# Patient Record
Sex: Female | Born: 1960 | Race: White | Hispanic: No | Smoking: Never smoker
Health system: Southern US, Community
[De-identification: ages and names within clinical notes are randomized; demographics above are authoritative.]

## PROBLEM LIST (undated history)

## (undated) DIAGNOSIS — B019 Varicella without complication: Secondary | ICD-10-CM

## (undated) HISTORY — DX: Varicella without complication: B01.9

## (undated) HISTORY — PX: DILATION AND CURETTAGE OF UTERUS: SHX78

---

## 1998-11-15 ENCOUNTER — Other Ambulatory Visit: Admission: RE | Admit: 1998-11-15 | Discharge: 1998-11-15 | Payer: Self-pay | Admitting: Obstetrics and Gynecology

## 2000-08-25 ENCOUNTER — Other Ambulatory Visit: Admission: RE | Admit: 2000-08-25 | Discharge: 2000-08-25 | Payer: Self-pay | Admitting: Obstetrics and Gynecology

## 2004-05-13 ENCOUNTER — Ambulatory Visit: Payer: Self-pay | Admitting: Family Medicine

## 2005-02-23 ENCOUNTER — Ambulatory Visit: Payer: Self-pay | Admitting: Family Medicine

## 2005-03-09 ENCOUNTER — Ambulatory Visit: Payer: Self-pay | Admitting: Family Medicine

## 2005-03-31 ENCOUNTER — Ambulatory Visit: Payer: Self-pay | Admitting: Family Medicine

## 2007-04-29 ENCOUNTER — Ambulatory Visit: Payer: Self-pay | Admitting: Family Medicine

## 2007-04-29 DIAGNOSIS — J019 Acute sinusitis, unspecified: Secondary | ICD-10-CM | POA: Insufficient documentation

## 2007-04-29 DIAGNOSIS — Z9189 Other specified personal risk factors, not elsewhere classified: Secondary | ICD-10-CM | POA: Insufficient documentation

## 2007-04-29 DIAGNOSIS — J309 Allergic rhinitis, unspecified: Secondary | ICD-10-CM | POA: Insufficient documentation

## 2008-01-12 ENCOUNTER — Ambulatory Visit: Payer: Self-pay | Admitting: Family Medicine

## 2008-12-05 ENCOUNTER — Ambulatory Visit: Payer: Self-pay | Admitting: Family Medicine

## 2008-12-05 DIAGNOSIS — M5412 Radiculopathy, cervical region: Secondary | ICD-10-CM | POA: Insufficient documentation

## 2009-12-23 ENCOUNTER — Other Ambulatory Visit
Admission: RE | Admit: 2009-12-23 | Discharge: 2009-12-23 | Payer: Self-pay | Source: Home / Self Care | Admitting: Family Medicine

## 2009-12-23 ENCOUNTER — Ambulatory Visit: Payer: Self-pay | Admitting: Family Medicine

## 2009-12-25 ENCOUNTER — Encounter: Payer: Self-pay | Admitting: Family Medicine

## 2010-01-16 ENCOUNTER — Ambulatory Visit: Payer: Self-pay | Admitting: Family Medicine

## 2010-01-16 LAB — CONVERTED CEMR LAB
Bilirubin Urine: NEGATIVE
Glucose, Urine, Semiquant: NEGATIVE
Protein, U semiquant: NEGATIVE
Specific Gravity, Urine: 1.01
pH: 6.5

## 2010-01-23 LAB — CONVERTED CEMR LAB
ALT: 20 units/L (ref 0–35)
AST: 17 units/L (ref 0–37)
Albumin: 3.7 g/dL (ref 3.5–5.2)
BUN: 16 mg/dL (ref 6–23)
Cholesterol: 201 mg/dL — ABNORMAL HIGH (ref 0–200)
Creatinine, Ser: 0.8 mg/dL (ref 0.4–1.2)
Eosinophils Relative: 1.1 % (ref 0.0–5.0)
Glucose, Bld: 87 mg/dL (ref 70–99)
HDL: 45.2 mg/dL (ref >39.00)
Lymphocytes Relative: 31.5 % (ref 12.0–46.0)
Monocytes Relative: 6.6 % (ref 3.0–12.0)
Neutrophils Relative %: 60.3 % (ref 43.0–77.0)
Platelets: 252 10*3/uL (ref 150.0–400.0)
RBC: 4.53 M/uL (ref 3.87–5.11)
Sodium: 136 meq/L (ref 135–145)
Total CHOL/HDL Ratio: 4
Triglycerides: 93 mg/dL (ref 0.0–149.0)
VLDL: 18.6 mg/dL (ref 0.0–40.0)
WBC: 6.4 10*3/uL (ref 4.5–10.5)

## 2010-02-27 NOTE — Assessment & Plan Note (Signed)
Summary: cpx//pap//ccm-labs//ccm   Vital Signs:  Patient profile:   50 year old female Weight:      208 pounds O2 Sat:      98 % Temp:     98.5 degrees F Pulse rate:   79 / minute BP sitting:   112 / 80  (left arm)  Vitals Entered By: Pura Spice, RN (December 23, 2009 3:04 PM) CC: cpx    History of Present Illness: 50 yr old female for a cpx. She has not had an exam or labs for about 5 years now. She feels fine and has no complaints.   Allergies (verified): No Known Drug Allergies  Past History:  Past Medical History: Allergic rhinitis Chickenpox  Past Surgical History: Reviewed history from 04/29/2007 and no changes required. D&C  Family History: Reviewed history from 09/21/2006 and no changes required. Family History Diabetes 1st degree relative Family History High cholesterol Family History Hypertension Family History Other cancer-breast Family History of Cardiovascular disorder  Social History: Reviewed history from 04/29/2007 and no changes required. Married Current Smoker Alcohol use-yes Occupation:massage therapist  Review of Systems  The patient denies anorexia, fever, weight loss, weight gain, vision loss, decreased hearing, hoarseness, chest pain, syncope, dyspnea on exertion, peripheral edema, prolonged cough, headaches, hemoptysis, abdominal pain, melena, hematochezia, severe indigestion/heartburn, hematuria, incontinence, genital sores, muscle weakness, suspicious skin lesions, transient blindness, difficulty walking, depression, unusual weight change, abnormal bleeding, enlarged lymph nodes, angioedema, breast masses, and testicular masses.    Physical Exam  General:  overweight-appearing.   Head:  Normocephalic and atraumatic without obvious abnormalities. No apparent alopecia or balding. Eyes:  No corneal or conjunctival inflammation noted. EOMI. Perrla. Funduscopic exam benign, without hemorrhages, exudates or papilledema. Vision grossly  normal. Ears:  External ear exam shows no significant lesions or deformities.  Otoscopic examination reveals clear canals, tympanic membranes are intact bilaterally without bulging, retraction, inflammation or discharge. Hearing is grossly normal bilaterally. Nose:  External nasal examination shows no deformity or inflammation. Nasal mucosa are pink and moist without lesions or exudates. Mouth:  Oral mucosa and oropharynx without lesions or exudates.  Teeth in good repair. Neck:  No deformities, masses, or tenderness noted. Chest Wall:  No deformities, masses, or tenderness noted. Breasts:  No mass, nodules, thickening, tenderness, bulging, retraction, inflamation, nipple discharge or skin changes noted.   Lungs:  Normal respiratory effort, chest expands symmetrically. Lungs are clear to auscultation, no crackles or wheezes. Heart:  Normal rate and regular rhythm. S1 and S2 normal without gallop, murmur, click, rub or other extra sounds. Abdomen:  Bowel sounds positive,abdomen soft and non-tender without masses, organomegaly or hernias noted. Genitalia:  Pelvic Exam:        External: normal female genitalia without lesions or masses        Vagina: normal without lesions or masses        Cervix: normal without lesions or masses        Adnexa: normal bimanual exam without masses or fullness        Uterus: normal by palpation        Pap smear: performed Msk:  No deformity or scoliosis noted of thoracic or lumbar spine.   Pulses:  R and L carotid,radial,femoral,dorsalis pedis and posterior tibial pulses are full and equal bilaterally Extremities:  No clubbing, cyanosis, edema, or deformity noted with normal full range of motion of all joints.   Neurologic:  No cranial nerve deficits noted. Station and gait are normal. Plantar reflexes are down-going  bilaterally. DTRs are symmetrical throughout. Sensory, motor and coordinative functions appear intact. Skin:  Intact without suspicious lesions or  rashes Cervical Nodes:  No lymphadenopathy noted Axillary Nodes:  No palpable lymphadenopathy Inguinal Nodes:  No significant adenopathy Psych:  Cognition and judgment appear intact. Alert and cooperative with normal attention span and concentration. No apparent delusions, illusions, hallucinations   Impression & Recommendations:  Problem # 1:  HEALTH MAINTENANCE EXAM (ICD-V70.0)  Complete Medication List: 1)  Vicodin Es 7.5-750 Mg Tabs (Hydrocodone-acetaminophen) .Marland Kitchen.. 1 q 6 hours as needed pain 2)  Diclofenac Sodium 50 Mg Tbec (Diclofenac sodium) .... Three times a day as needed pain 3)  Flexeril 10 Mg Tabs (Cyclobenzaprine hcl) .... Three times a day as needed spasm  Other Orders: Tdap => 58yrs IM (30865) Admin 1st Vaccine (78469)  Patient Instructions: 1)  It is important that you exercise reguarly at least 20 minutes 5 times a week. If you develop chest pain, have severe difficulty breathing, or feel very tired, stop exercising immediately and seek medical attention.  2)  You need to lose weight. Consider a lower calorie diet and regular exercise.  3)  Schedule your mammogram.  4)  set up fasting labs soon    Orders Added: 1)  Tdap => 72yrs IM [90715] 2)  Admin 1st Vaccine [90471] 3)  Est. Patient 40-64 years [62952]   Immunizations Administered:  Tetanus Vaccine:    Vaccine Type: Tdap    Mfr: GlaxoSmithKline    Dose: 0.5 ml    Route: IM    Given by: Pura Spice, RN    Lot #: [id number]    VIS given: 12/14/07 version given December 23, 2009.   Immunizations Administered:  Tetanus Vaccine:    Vaccine Type: Tdap    Mfr: GlaxoSmithKline    Dose: 0.5 ml    Route: IM    Given by: Pura Spice, RN    Lot #: [id number]    VIS given: 12/14/07 version given December 23, 2009.

## 2010-02-27 NOTE — Letter (Signed)
Summary: Results Follow-up Letter  [place] [facility]  [address] [place], [place] [address]   Phone: [phone]  Fax: [phone]    12/25/2009  [address], [place]  [address]  Dear Ms. Mcclean,   The following are the results of your recent test(s):  Test     Result     Pap Smear    Normal___yes  repeat 1 yr.   Sincerely,     Dr Gershon Crane, MD [place] [facility]

## 2010-12-16 ENCOUNTER — Encounter: Payer: Self-pay | Admitting: Family Medicine

## 2010-12-16 ENCOUNTER — Ambulatory Visit (INDEPENDENT_AMBULATORY_CARE_PROVIDER_SITE_OTHER): Payer: BC Managed Care – PPO | Admitting: Family Medicine

## 2010-12-16 VITALS — BP 110/70 | HR 89 | Temp 98.5°F | Wt 219.0 lb

## 2010-12-16 DIAGNOSIS — J329 Chronic sinusitis, unspecified: Secondary | ICD-10-CM

## 2010-12-16 MED ORDER — FLUCONAZOLE 150 MG PO TABS: 150.0000 mg | ORAL_TABLET | Freq: Once | ORAL | Status: DC

## 2010-12-16 MED ORDER — LEVOFLOXACIN 500 MG PO TABS: 500.0000 mg | ORAL_TABLET | Freq: Every day | ORAL | Status: AC

## 2010-12-17 ENCOUNTER — Encounter: Payer: Self-pay | Admitting: Family Medicine

## 2010-12-17 NOTE — Progress Notes (Signed)
  Subjective:    Patient ID: Carol Kirk, female    DOB: 1960-07-16, 50 y.o.   MRN: [id number]  HPI Here for one week of sinus pressure, PND, ST, HA, dry cough, and fever.    Review of Systems  Constitutional: Positive for fever.  HENT: Positive for congestion, postnasal drip and sinus pressure.   Respiratory: Positive for cough.        Objective:   Physical Exam  Constitutional: She appears well-developed and well-nourished.  HENT:  Right Ear: External ear normal.  Left Ear: External ear normal.  Nose: Nose normal.  Mouth/Throat: Oropharynx is clear and moist.  Eyes: Conjunctivae are normal. Pupils are equal, round, and reactive to light.  Neck: No thyromegaly present.  Pulmonary/Chest: Effort normal and breath sounds normal.  Lymphadenopathy:    She has no cervical adenopathy.          Assessment & Plan:  Rest, fluids, Mucinex

## 2011-01-30 ENCOUNTER — Telehealth: Payer: Self-pay | Admitting: *Deleted

## 2011-01-30 NOTE — Telephone Encounter (Signed)
Pt had mammogram done at breast clinic in [place] [phone] on 01-13-11

## 2011-01-30 NOTE — Telephone Encounter (Signed)
Pt had a mammogram and she got a call stating they needed additional test on the right breast and Dr Claris Che office would need to tell her why.  Pt is very concerned and would like someone to call at their earliest convenience

## 2011-01-30 NOTE — Telephone Encounter (Signed)
We have already talked to her about this. I signed the order to do the additional testing, but I have not heard anything specific about the findings yet

## 2011-02-02 ENCOUNTER — Encounter: Payer: Self-pay | Admitting: Family Medicine

## 2011-02-27 ENCOUNTER — Encounter: Payer: Self-pay | Admitting: Family Medicine

## 2011-08-10 ENCOUNTER — Telehealth: Payer: Self-pay | Admitting: Family Medicine

## 2011-08-10 NOTE — Telephone Encounter (Signed)
Pt requesting an order to be placed to a diagnostic mammogram. Please send to [facility]   Please fax to [phone]

## 2011-08-10 NOTE — Telephone Encounter (Signed)
I faxed order to below number.  

## 2011-08-10 NOTE — Telephone Encounter (Signed)
Written, in your box

## 2011-08-18 ENCOUNTER — Telehealth: Payer: Self-pay | Admitting: Family Medicine

## 2011-08-18 NOTE — Telephone Encounter (Signed)
I did refax order to below number.

## 2011-08-18 NOTE — Telephone Encounter (Signed)
Pt called and said that the original fax # for [facility] was incorrect. Pt is requesting the order for diagnostic mammogram be resent to different fax #. The correct Fax # is [phone] [facility].

## 2011-08-27 ENCOUNTER — Telehealth: Payer: Self-pay | Admitting: Family Medicine

## 2011-08-27 NOTE — Telephone Encounter (Signed)
The [facility] for [facility] called and said that the pt is schd to come in for a MM today a 1pm. May need to have an ultrasound as well. Sent over an order req yesterday, but haven't gotten a response. Pls fax back asap this morning to fax # [phone] [employer]

## 2011-08-27 NOTE — Telephone Encounter (Signed)
Note was signed and I faxed it.

## 2011-09-09 ENCOUNTER — Encounter: Payer: Self-pay | Admitting: Family Medicine

## 2012-01-11 ENCOUNTER — Telehealth: Payer: Self-pay | Admitting: Family Medicine

## 2012-01-11 NOTE — Telephone Encounter (Signed)
Done/kjh 

## 2012-01-11 NOTE — Telephone Encounter (Signed)
Okay to schedule

## 2012-01-11 NOTE — Telephone Encounter (Signed)
Pt's husband states wife upper resp/sinus, sore throat and has been going on a couple of weeks, no better. Would like to be seen in the AM.

## 2012-01-12 ENCOUNTER — Encounter: Payer: Self-pay | Admitting: Family Medicine

## 2012-01-12 ENCOUNTER — Ambulatory Visit (INDEPENDENT_AMBULATORY_CARE_PROVIDER_SITE_OTHER): Payer: BC Managed Care – PPO | Admitting: Family Medicine

## 2012-01-12 VITALS — BP 114/64 | HR 91 | Temp 97.9°F | Wt 209.0 lb

## 2012-01-12 DIAGNOSIS — J329 Chronic sinusitis, unspecified: Secondary | ICD-10-CM

## 2012-01-12 MED ORDER — DICLOFENAC POTASSIUM 50 MG PO TABS: 50.0000 mg | ORAL_TABLET | Freq: Three times a day (TID) | ORAL | Status: DC | PRN

## 2012-01-12 MED ORDER — CYCLOBENZAPRINE HCL 10 MG PO TABS: 10.0000 mg | ORAL_TABLET | Freq: Three times a day (TID) | ORAL | Status: DC | PRN

## 2012-01-12 MED ORDER — FLUCONAZOLE 150 MG PO TABS: 150.0000 mg | ORAL_TABLET | Freq: Once | ORAL | Status: AC

## 2012-01-12 MED ORDER — LEVOFLOXACIN 500 MG PO TABS: 500.0000 mg | ORAL_TABLET | Freq: Every day | ORAL | Status: AC

## 2012-01-12 NOTE — Progress Notes (Signed)
  Subjective:    Patient ID: Carol Kirk, female    DOB: Jun 07, 1960, 51 y.o.   MRN: [id number]  HPI Here for 2 weeks of sinus pressure, HA, ST, PND, and a dry cough. No fever.    Review of Systems  Constitutional: Negative.   HENT: Positive for congestion, postnasal drip and sinus pressure.   Eyes: Negative.   Respiratory: Negative.        Objective:   Physical Exam  Constitutional: She appears well-developed and well-nourished.  HENT:  Right Ear: External ear normal.  Left Ear: External ear normal.  Nose: Nose normal.  Mouth/Throat: Oropharynx is clear and moist.  Eyes: Conjunctivae normal are normal.  Pulmonary/Chest: Effort normal and breath sounds normal.  Lymphadenopathy:    She has no cervical adenopathy.          Assessment & Plan:  Add Mucinex prn

## 2012-09-30 ENCOUNTER — Encounter: Payer: Self-pay | Admitting: Family Medicine

## 2012-09-30 ENCOUNTER — Ambulatory Visit (INDEPENDENT_AMBULATORY_CARE_PROVIDER_SITE_OTHER): Payer: BC Managed Care – PPO | Admitting: Family Medicine

## 2012-09-30 ENCOUNTER — Telehealth: Payer: Self-pay | Admitting: Family Medicine

## 2012-09-30 VITALS — BP 108/66 | HR 78 | Temp 98.6°F | Wt 221.0 lb

## 2012-09-30 DIAGNOSIS — J019 Acute sinusitis, unspecified: Secondary | ICD-10-CM

## 2012-09-30 MED ORDER — DICLOFENAC POTASSIUM 50 MG PO TABS: 50.0000 mg | ORAL_TABLET | Freq: Three times a day (TID) | ORAL | Status: DC | PRN

## 2012-09-30 MED ORDER — FLUCONAZOLE 150 MG PO TABS: 150.0000 mg | ORAL_TABLET | Freq: Once | ORAL | Status: DC

## 2012-09-30 MED ORDER — HYDROCODONE-ACETAMINOPHEN 10-325 MG PO TABS: 1.0000 | ORAL_TABLET | Freq: Four times a day (QID) | ORAL | Status: AC | PRN

## 2012-09-30 MED ORDER — CYCLOBENZAPRINE HCL 10 MG PO TABS: 10.0000 mg | ORAL_TABLET | Freq: Three times a day (TID) | ORAL | Status: AC | PRN

## 2012-09-30 MED ORDER — LEVOFLOXACIN 500 MG PO TABS: 500.0000 mg | ORAL_TABLET | Freq: Every day | ORAL | Status: AC

## 2012-09-30 NOTE — Telephone Encounter (Signed)
Pharm is calling to notify  MD theres a drug interaction with fluconazole and levofloxacin.

## 2012-09-30 NOTE — Telephone Encounter (Signed)
I spoke with pharmacy, per Dr. Clent Ridges he is aware of this.

## 2012-09-30 NOTE — Progress Notes (Signed)
  Subjective:    Patient ID: Carol Kirk, female    DOB: [birth year], 52 y.o.   MRN: [id number]  HPI Here for one week of sinus pressure, PND, HA, and ST. On Mucinex.    Review of Systems  Constitutional: Negative.   HENT: Positive for congestion, postnasal drip and sinus pressure.   Eyes: Negative.   Respiratory: Negative.        Objective:   Physical Exam  Constitutional: She appears well-developed and well-nourished.  HENT:  Right Ear: External ear normal.  Left Ear: External ear normal.  Nose: Nose normal.  Mouth/Throat: Oropharynx is clear and moist.  Eyes: Conjunctivae are normal.  Pulmonary/Chest: Effort normal and breath sounds normal.  Lymphadenopathy:    She has no cervical adenopathy.          Assessment & Plan:  Drink fluids.

## 2012-10-05 ENCOUNTER — Emergency Department (INDEPENDENT_AMBULATORY_CARE_PROVIDER_SITE_OTHER)
Admission: EM | Admit: 2012-10-05 | Discharge: 2012-10-05 | Disposition: A | Discharge status: Home / Self Care | Payer: BC Managed Care – PPO | Source: Home / Self Care | Attending: Family Medicine | Admitting: Family Medicine

## 2012-10-05 ENCOUNTER — Encounter: Payer: Self-pay | Admitting: *Deleted

## 2012-10-05 ENCOUNTER — Telehealth: Payer: Self-pay | Admitting: Family Medicine

## 2012-10-05 DIAGNOSIS — J019 Acute sinusitis, unspecified: Secondary | ICD-10-CM

## 2012-10-05 LAB — POCT RAPID STREP A (OFFICE): Rapid Strep A Screen: NEGATIVE

## 2012-10-05 MED ORDER — METHYLPREDNISOLONE ACETATE 40 MG/ML IJ SUSP
80.0000 mg | Freq: Once | INTRAMUSCULAR | Status: AC
  Administered 2012-10-05: 80 mg via INTRAMUSCULAR

## 2012-10-05 NOTE — Telephone Encounter (Signed)
I need more info about this. Why does she think these tests are needed?

## 2012-10-05 NOTE — ED Provider Notes (Signed)
CSN: [id number]     Arrival date & time 10/05/12  1403 History   First MD Initiated Contact with Patient 10/05/12 1422     Chief Complaint  Patient presents with  . Sore Throat  . Cough   HPI  SINUSITIS Onset:  2-3 weeks  Location: maxillary sinuses  Description:sinus pressure and drainage  Modifying factors: Was seen by PCP 5-6 days. Started on levaquin. Sxs have not improved.   Symptoms Cough:  yes Discharge:  yes Fever: no Sinus Pressure:  Mild Ears Blocked:  no Teeth Ache:  no Frontal Headache:  no Second Sickening:  no  Red Flags Change in mental state: no Change in vision: no    Past Medical History  Diagnosis Date  . Allergic rhinitis   . Chicken pox    Past Surgical History  Procedure Laterality Date  . Dilation and curettage of uterus     Family History  Problem Relation Age of Onset  . Diabetes Father     type II   History  Substance Use Topics  . Smoking status: Never Smoker   . Smokeless tobacco: Never Used  . Alcohol Use: 1.0 oz/week    2 drink(s) per week   OB History   Grav Para Term Preterm Abortions TAB SAB Ect Mult Living                 Review of Systems  All other systems reviewed and are negative.    Allergies  Review of patient's allergies indicates no known allergies.  Home Medications   Current Outpatient Rx  Name  Route  Sig  Dispense  Refill  . cyclobenzaprine (FLEXERIL) 10 MG tablet   Oral   Take 1 tablet (10 mg total) by mouth 3 (three) times daily as needed for muscle spasms.   60 tablet   5   . diclofenac (CATAFLAM) 50 MG tablet   Oral   Take 1 tablet (50 mg total) by mouth 3 (three) times daily as needed (pain ).   60 tablet   5   . fluconazole (DIFLUCAN) 150 MG tablet   Oral   Take 1 tablet (150 mg total) by mouth once.   1 tablet   5   . HYDROcodone-acetaminophen (NORCO) 10-325 MG per tablet   Oral   Take 1 tablet by mouth every 6 (six) hours as needed for pain.   30 tablet   5   .  levofloxacin (LEVAQUIN) 500 MG tablet   Oral   Take 1 tablet (500 mg total) by mouth daily.   10 tablet   0    BP 111/73  Pulse 81  Temp(Src) 98.2 F (36.8 C) (Oral)  Resp 16  Ht 5\' 7"  (1.702 m)  Wt 219 lb (99.338 kg)  BMI 34.29 kg/m2  SpO2 99% Physical Exam  Constitutional: She appears well-developed and well-nourished.  HENT:  Head: Normocephalic and atraumatic.  Right Ear: External ear normal.  +nasal erythema, rhinorrhea bilaterally, + post oropharyngeal erythema  + bilateral maxillary sinus TTP    Eyes: Conjunctivae are normal. Pupils are equal, round, and reactive to light.  Neck: Normal range of motion.  Cardiovascular: Normal rate and regular rhythm.   Pulmonary/Chest: Effort normal and breath sounds normal.  Abdominal: Soft.  Musculoskeletal: Normal range of motion.  Neurological: She is alert.  Skin: Skin is warm.    ED Course  Procedures (including critical care time) Labs Review Labs Reviewed  POCT RAPID STREP A (OFFICE)  Imaging Review No results found.  MDM   1. Acute sinusitis, unspecified    Depomedrol 80mg  IM x1 Suspect likely protracted viral illness.  On the appropriate high strength abx.  Complete course.  Follow up with ENT if sxs fail to improve/worsen.  Discussed ENT red flags at length.     The patient and/or caregiver has been counseled thoroughly with regard to treatment plan and/or medications prescribed including dosage, schedule, interactions, rationale for use, and possible side effects and they verbalize understanding. Diagnoses and expected course of recovery discussed and will return if not improved as expected or if the condition worsens. Patient and/or caregiver verbalized understanding.          Doree Albee, MD 10/05/12 [phone]

## 2012-10-05 NOTE — Telephone Encounter (Signed)
[facility] [place] and will forward any info to [place]. Pt lives in [place] and wants to move forward today. Pt said she appreciates all we have done.

## 2012-10-05 NOTE — Telephone Encounter (Signed)
Pt is aware waiting on MD °

## 2012-10-05 NOTE — Telephone Encounter (Signed)
Patient's symptoms are not improving.  She still has a lot of chest congestion and she has a cough with greenish mucous.  She is too fatigued to work.  She would like to know if it is possible to have a chest x-ray?

## 2012-10-05 NOTE — Telephone Encounter (Signed)
Can you call pt and get more info

## 2012-10-05 NOTE — Telephone Encounter (Signed)
Pt requesting to be worked in today with PCP, states she was seen last Friday for sinus infection was prescribed levofloxacin (LEVAQUIN) 500 MG tablet.  Pt states she is now feeling worse than before and wants to get lab work and chest x-ray.  Pt informed no appts available this afternoon with PCP, pt declined to see another provider.  Patient states she is in [place] and it will take her 40 minutes to get here.  Please advise if ok to work in today.

## 2012-10-05 NOTE — Telephone Encounter (Signed)
noted 

## 2012-10-05 NOTE — ED Notes (Signed)
Haely c/o productive cough, sore throat, sinus pain fatigue and some wheezing x 2 weeks. She has been on Levaquin for 5 days without much relief. Her PCP was unable to see her today for a follow up.

## 2012-10-08 ENCOUNTER — Telehealth: Payer: Self-pay

## 2012-10-08 NOTE — ED Notes (Signed)
Left a message on voice mail asking how patient is feeling and advising to call back with any questions or concerns.  

## 2012-10-26 ENCOUNTER — Telehealth: Payer: Self-pay | Admitting: Family Medicine

## 2012-10-26 NOTE — Telephone Encounter (Signed)
Pt needs another order for a right breast diagnostic mammogram and fax to [phone]. Script was faxed again.

## 2012-10-28 ENCOUNTER — Telehealth: Payer: Self-pay | Admitting: Family Medicine

## 2012-10-28 DIAGNOSIS — R928 Other abnormal and inconclusive findings on diagnostic imaging of breast: Secondary | ICD-10-CM

## 2012-10-28 NOTE — Telephone Encounter (Signed)
Pt needs referral for diagnostic mammogram sent to the breast clinic of [place].  Pt states she left a message on your voice mail. Pt states if you can put order in epic, the breast center states they can access. Pt states Dr Clent Ridges wrote this order last week and she has not gotten yet.

## 2012-10-31 NOTE — Telephone Encounter (Signed)
I put order in computer.

## 2012-11-29 ENCOUNTER — Encounter: Payer: Self-pay | Admitting: Family Medicine

## 2013-02-23 ENCOUNTER — Ambulatory Visit (INDEPENDENT_AMBULATORY_CARE_PROVIDER_SITE_OTHER): Payer: BC Managed Care – PPO | Admitting: Family Medicine

## 2013-02-23 ENCOUNTER — Encounter: Payer: Self-pay | Admitting: Family Medicine

## 2013-02-23 VITALS — BP 110/64 | HR 105 | Temp 99.1°F | Ht 67.0 in | Wt 225.0 lb

## 2013-02-23 DIAGNOSIS — J209 Acute bronchitis, unspecified: Secondary | ICD-10-CM

## 2013-02-23 MED ORDER — HYDROCODONE-HOMATROPINE 5-1.5 MG/5ML PO SYRP: 5.0000 mL | ORAL_SOLUTION | ORAL | Status: AC | PRN

## 2013-02-23 MED ORDER — LEVOFLOXACIN 500 MG PO TABS: 500.0000 mg | ORAL_TABLET | Freq: Every day | ORAL | Status: AC

## 2013-02-23 MED ORDER — FLUCONAZOLE 150 MG PO TABS: 150.0000 mg | ORAL_TABLET | Freq: Once | ORAL | Status: AC

## 2013-02-23 NOTE — Progress Notes (Signed)
Pre visit review using our clinic review tool, if applicable. No additional management support is needed unless otherwise documented below in the visit note. 

## 2013-02-23 NOTE — Progress Notes (Signed)
   Subjective:    Patient ID: Carol Kirk, female    DOB: [birth year], 53 y.o.   MRN: [id number]  HPI Here for 3 days of fever, aches, chest tightness and coughing up green sputum.    Review of Systems  Constitutional: Positive for fever.  HENT: Positive for congestion.   Eyes: Negative.   Respiratory: Positive for cough and chest tightness.        Objective:   Physical Exam  Constitutional: She appears well-developed and well-nourished.  HENT:  Right Ear: External ear normal.  Left Ear: External ear normal.  Nose: Nose normal.  Mouth/Throat: Oropharynx is clear and moist.  Eyes: Conjunctivae are normal.  Pulmonary/Chest: Effort normal and breath sounds normal. No respiratory distress. She has no wheezes. She has no rales.  Lymphadenopathy:    She has no cervical adenopathy.          Assessment & Plan:  Bronchitis secondary to influenza.

## 2014-07-24 ENCOUNTER — Encounter: Payer: Self-pay | Admitting: Family Medicine

## 2014-07-24 ENCOUNTER — Ambulatory Visit (INDEPENDENT_AMBULATORY_CARE_PROVIDER_SITE_OTHER): Payer: BLUE CROSS/BLUE SHIELD | Admitting: Family Medicine

## 2014-07-24 VITALS — BP 119/84 | HR 85 | Temp 98.3°F | Ht 67.0 in | Wt 231.0 lb

## 2014-07-24 DIAGNOSIS — F4321 Adjustment disorder with depressed mood: Secondary | ICD-10-CM | POA: Diagnosis not present

## 2014-07-24 DIAGNOSIS — M25571 Pain in right ankle and joints of right foot: Secondary | ICD-10-CM

## 2014-07-24 MED ORDER — ALPRAZOLAM 0.5 MG PO TABS: 0.5000 mg | ORAL_TABLET | Freq: Three times a day (TID) | ORAL | Status: AC | PRN

## 2014-07-24 MED ORDER — MELOXICAM 15 MG PO TABS: 15.0000 mg | ORAL_TABLET | Freq: Every day | ORAL | Status: DC

## 2014-07-24 NOTE — Progress Notes (Signed)
   Subjective:    Patient ID: Carol Kirk, female    DOB: [birth year], 54 y.o.   MRN: [id number]  HPI Here for several things. First of all she asks for a rx for Xanax to help her deal with the recent death of her husband. [name] was on the job last week in [place], [place] when he collapsed from an MI. [facility] and they attempted to do an emergency cardiac cath but they were unable to revive him. Of course this was totally unforeseen and the family has been devastated. She has had plenty of support with family and friends. She has taken some of her daughter's Xanax and this has helped her relax and to get some sleep. The funeral is planeed for this weekend and relatives are coming in from all over the [place]. Also she has had numerous injuries to the right ankle and she has pain in it from time to time. Her Orthopedist had been giving her Meloxicam for this and she asks if we could take over prescribing this.    Review of Systems  Constitutional: Negative.   Musculoskeletal: Positive for joint swelling and arthralgias.  Psychiatric/Behavioral: Positive for sleep disturbance and dysphoric mood. Negative for hallucinations, behavioral problems, confusion, decreased concentration and agitation. The patient is nervous/anxious.        Objective:   Physical Exam  Constitutional: She appears well-developed and well-nourished.  Musculoskeletal:  Right ankle is puffy and mildly tender, full ROM  Psychiatric:  Anxious, tearful           Assessment & Plan:  We wrote for Meloxicam for the ankle pain. Wrote for some Xanax that she can use while she goes through the grieving process. Recheck prn

## 2014-07-24 NOTE — Progress Notes (Signed)
Pre visit review using our clinic review tool, if applicable. No additional management support is needed unless otherwise documented below in the visit note. 

## 2014-08-06 ENCOUNTER — Telehealth: Payer: Self-pay | Admitting: Family Medicine

## 2014-08-06 NOTE — Telephone Encounter (Signed)
Call in Lexapro 10 mg daily, #30 with 2 rf  

## 2014-08-06 NOTE — Telephone Encounter (Signed)
Pt was seen on 07-24-14 for grief reaction. Pt lost her husband and would like something [facility]  for [facility] . Pt is req low dose depression med

## 2014-08-07 MED ORDER — ESCITALOPRAM OXALATE 10 MG PO TABS: 10.0000 mg | ORAL_TABLET | Freq: Every day | ORAL | Status: DC

## 2014-08-07 NOTE — Telephone Encounter (Signed)
I sent script e-scribe and left a voice message for pt with this information.  

## 2014-10-18 ENCOUNTER — Telehealth: Payer: Self-pay | Admitting: Family Medicine

## 2014-10-18 MED ORDER — ESCITALOPRAM OXALATE 10 MG PO TABS: 10.0000 mg | ORAL_TABLET | Freq: Every day | ORAL | Status: DC

## 2014-10-18 NOTE — Telephone Encounter (Signed)
done

## 2015-09-03 ENCOUNTER — Other Ambulatory Visit: Payer: Self-pay | Admitting: Family Medicine

## 2015-11-01 ENCOUNTER — Other Ambulatory Visit: Payer: Self-pay | Admitting: Family Medicine

## 2015-12-10 ENCOUNTER — Other Ambulatory Visit: Payer: Self-pay | Admitting: Family Medicine

## 2016-01-16 ENCOUNTER — Other Ambulatory Visit: Payer: Self-pay | Admitting: Family Medicine

## 2016-01-16 NOTE — Telephone Encounter (Signed)
Patient last seen 07/24/14 - ok to refill?

## 2016-02-05 ENCOUNTER — Ambulatory Visit: Payer: BLUE CROSS/BLUE SHIELD | Admitting: Adult Health

## 2016-09-21 ENCOUNTER — Emergency Department (INDEPENDENT_AMBULATORY_CARE_PROVIDER_SITE_OTHER): Payer: BLUE CROSS/BLUE SHIELD

## 2016-09-21 ENCOUNTER — Emergency Department (INDEPENDENT_AMBULATORY_CARE_PROVIDER_SITE_OTHER)
Admission: EM | Admit: 2016-09-21 | Discharge: 2016-09-21 | Disposition: A | Discharge status: Home / Self Care | Payer: BLUE CROSS/BLUE SHIELD | Source: Home / Self Care | Attending: Family Medicine | Admitting: Family Medicine

## 2016-09-21 DIAGNOSIS — M1712 Unilateral primary osteoarthritis, left knee: Secondary | ICD-10-CM

## 2016-09-21 DIAGNOSIS — S8002XA Contusion of left knee, initial encounter: Secondary | ICD-10-CM | POA: Diagnosis not present

## 2016-09-21 DIAGNOSIS — S7001XA Contusion of right hip, initial encounter: Secondary | ICD-10-CM

## 2016-09-21 DIAGNOSIS — M25551 Pain in right hip: Secondary | ICD-10-CM | POA: Diagnosis not present

## 2016-09-21 DIAGNOSIS — S7011XA Contusion of right thigh, initial encounter: Secondary | ICD-10-CM | POA: Diagnosis not present

## 2016-09-21 MED ORDER — KETOROLAC TROMETHAMINE 60 MG/2ML IM SOLN
60.0000 mg | Freq: Once | INTRAMUSCULAR | Status: AC
  Administered 2016-09-21: 60 mg via INTRAMUSCULAR

## 2016-09-21 MED ORDER — TRAMADOL HCL 50 MG PO TABS: 50.0000 mg | ORAL_TABLET | Freq: Four times a day (QID) | ORAL | 0 refills | Status: AC | PRN

## 2016-09-21 NOTE — Discharge Instructions (Signed)
Wear ace wrap on left knee until pain and swelling decrease. Apply ice pack for 20 to 30 minutes, 3 to 4 times daily  Continue until pain and swelling decrease.  May take Ibuprofen 200mg , 4 tabs every 8 hours with food.

## 2016-09-21 NOTE — ED Provider Notes (Signed)
[facility]    CSN: [id number] Arrival date & time: 09/21/16  1540     History   Chief Complaint Chief Complaint  Patient presents with  . Fall    HPI Carol Kirk is a 56 y.o. female.   Patient fell down 13 stairs about 4 hours ago, injuring her left knee and right hip.  She has soreness in her left trapezius area, but no neck pain.  No loss of consciousness.  She did not hit her head.   The history is provided by the patient.  Fall  This is a new problem. The current episode started 3 to 5 hours ago. The problem occurs constantly. The problem has not changed since onset.Pertinent negatives include no chest pain, no abdominal pain, no headaches and no shortness of breath. The symptoms are aggravated by walking (sitting). Nothing relieves the symptoms. Treatments tried: ice packs. The treatment provided mild relief.    Past Medical History:  Diagnosis Date  . Allergic rhinitis   . Chicken pox     Patient Active Problem List   Diagnosis Date Noted  . CERVICAL RADICULOPATHY 12/05/2008  . ACUTE SINUSITIS, UNSPECIFIED 04/29/2007  . ALLERGIC RHINITIS 04/29/2007  . CHICKENPOX, HX OF 04/29/2007    Past Surgical History:  Procedure Laterality Date  . DILATION AND CURETTAGE OF UTERUS      OB History    No data available       Home Medications    Prior to Admission medications   Medication Sig Start Date End Date Taking? Authorizing Provider  ALPRAZolam Prudy Feeler) 0.5 MG tablet Take 1 tablet (0.5 mg total) by mouth 3 (three) times daily as needed for anxiety. 07/24/14   Nelwyn Salisbury, MD  cyclobenzaprine (FLEXERIL) 10 MG tablet Take 1 tablet (10 mg total) by mouth 3 (three) times daily as needed for muscle spasms. 09/30/12   Nelwyn Salisbury, MD  escitalopram (LEXAPRO) 10 MG tablet TAKE 1 TABLET BY MOUTH DAILY 12/11/15   Nelwyn Salisbury, MD  fluconazole (DIFLUCAN) 150 MG tablet Take 1 tablet (150 mg total) by mouth once. Patient not taking: Reported on  07/24/2014 02/23/13   Nelwyn Salisbury, MD  HYDROcodone-acetaminophen [facility]) 10-325 MG per tablet Take 1 tablet by mouth every 6 (six) hours as needed for pain. Patient not taking: Reported on 07/24/2014 09/30/12   Nelwyn Salisbury, MD  HYDROcodone-homatropine (HYDROMET) 5-1.5 MG/5ML syrup Take 5 mLs by mouth every 4 (four) hours as needed for cough. Patient not taking: Reported on 07/24/2014 02/23/13   Nelwyn Salisbury, MD  meloxicam (MOBIC) 15 MG tablet TAKE 1 TABLET BY MOUTH EVERY DAY 01/16/16   Nelwyn Salisbury, MD  traMADol (ULTRAM) 50 MG tablet Take 1 tablet (50 mg total) by mouth every 6 (six) hours as needed for moderate pain. 09/21/16   Lattie Haw, MD    Family History Family History  Problem Relation Age of Onset  . Diabetes Father        type II    Social History Social History  Substance Use Topics  . Smoking status: Never Smoker  . Smokeless tobacco: Never Used  . Alcohol use 1.2 oz/week    2 Standard drinks or equivalent per week     Allergies   Patient has no known allergies.   Review of Systems Review of Systems  Respiratory: Negative for shortness of breath.   Cardiovascular: Negative for chest pain.  Gastrointestinal: Negative for abdominal pain.  Neurological: Negative for  headaches.  All other systems reviewed and are negative.    Physical Exam Triage Vital Signs ED Triage Vitals [09/21/16 1603]  Enc Vitals Group     BP 115/78     Pulse Rate 91     Resp      Temp 97.7 F (36.5 C)     Temp Source Oral     SpO2 100 %     Weight 215 lb (97.5 kg)     Height 5' 7.5" (1.715 m)     Head Circumference      Peak Flow      Pain Score 8     Pain Loc      Pain Edu?      Excl. in GC?    No data found.   Updated Vital Signs BP 115/78 (BP Location: Left Arm)   Pulse 91   Temp 97.7 F (36.5 C) (Oral)   Ht 5' 7.5" (1.715 m)   Wt 215 lb (97.5 kg)   LMP 11/15/2010   SpO2 100%   BMI 33.18 kg/m   Visual Acuity Right Eye Distance:   Left Eye  Distance:   Bilateral Distance:    Right Eye Near:   Left Eye Near:    Bilateral Near:     Physical Exam  Constitutional: She appears well-nourished. No distress.  HENT:  Head: Atraumatic.  Right Ear: External ear normal.  Left Ear: External ear normal.  Nose: Nose normal.  Mouth/Throat: Oropharynx is clear and moist.  Eyes: Pupils are equal, round, and reactive to light. Conjunctivae and EOM are normal.  Neck: Normal range of motion.  Cardiovascular: Normal heart sounds.   Pulmonary/Chest: Breath sounds normal.  Abdominal: There is no tenderness.  Musculoskeletal:       Right hip: She exhibits tenderness and swelling. She exhibits normal range of motion, normal strength, no bony tenderness, no deformity and no laceration.       Right knee: She exhibits normal range of motion, no swelling, no effusion, no ecchymosis, no deformity, no laceration, no erythema, normal alignment and no LCL laxity. Tenderness found. Lateral joint line and LCL tenderness noted. No patellar tendon tenderness noted.       Arms:      Legs: There is mild tenderness over the left trapezius muscle.  Neck and left shoulder have full range of motion.  Right hip area has hematoma as noted on diagram.   Right knee:  No effusion, erythema, or warmth.  Knee stable, negative drawer test.  McMurray test negative.  Tenderness over the lateral joint line and lateral collateral ligament   Neurological: She is alert.  Skin: Skin is dry.  Nursing note and vitals reviewed.    UC Treatments / Results  Labs (all labs ordered are listed, but only abnormal results are displayed) Labs Reviewed - No data to display  EKG  EKG Interpretation None       Radiology Dg Knee Complete 4 Views Left  Result Date: 09/21/2016 CLINICAL DATA:  Larey Seat downstairs today. EXAM: LEFT KNEE - COMPLETE 4+ VIEW COMPARISON:  None. FINDINGS: No fracture deformity or dislocation. Moderate patellofemoral, and medial compartment narrowing,  mild lateral compartment joint space narrowing with periarticular spurring and sclerosis. Proximal patella enthesopathy. No destructive bony lesions. Soft tissue planes are nonsuspicious. IMPRESSION: No acute fracture deformity or dislocation. Tricompartmental osteoarthrosis, moderate within medial and patellofemoral compartments. Electronically Signed   By: Awilda Metro M.D.   On: 09/21/2016 17:02   Dg Hip Unilat W  Or Wo Pelvis 2-3 Views Right  Result Date: 09/21/2016 CLINICAL DATA:  Larey Seat down stairs today. EXAM: DG HIP (WITH OR WITHOUT PELVIS) 2-3V RIGHT COMPARISON:  None. FINDINGS: There is no evidence of hip fracture or dislocation. There is no evidence of arthropathy or other focal bone abnormality. IMPRESSION: Negative. Electronically Signed   By: Awilda Metro M.D.   On: 09/21/2016 17:03    Procedures Procedures (including critical care time)  Medications Ordered in UC Medications  ketorolac (TORADOL) injection 60 mg (60 mg Intramuscular Given 09/21/16 1630)     Initial Impression / Assessment and Plan / UC Course  I have reviewed the triage vital signs and the nursing notes.  Pertinent labs & imaging results that were available during my care of the patient were reviewed by me and considered in my medical decision making (see chart for details).    Administered Toradol 60mg  IM. Ace wrap applied to left knee. Rx for Tramadol, one Q6hr prn, #15, no refill. Controlled Substance Prescriptions I have consulted the [place] Controlled Substances Registry for this patient, and feel the risk/benefit ratio today is favorable for proceeding with this prescription for a controlled substance.  Wear ace wrap on left knee until pain and swelling decrease. Apply ice pack for 20 to 30 minutes, 3 to 4 times daily  Continue until pain and swelling decrease.  May take Ibuprofen 200mg , 4 tabs every 8 hours with food.   Followup with Dr. Rodney Langton or Dr. Clementeen Graham ([facility]) if not improving about two weeks.     Final Clinical Impressions(s) / UC Diagnoses   Final diagnoses:  Contusion of right hip and thigh, initial encounter  Contusion of left knee, initial encounter    New Prescriptions New Prescriptions   TRAMADOL (ULTRAM) 50 MG TABLET    Take 1 tablet (50 mg total) by mouth every 6 (six) hours as needed for moderate pain.         Lattie Haw, MD 09/21/16 [phone]

## 2016-09-21 NOTE — ED Triage Notes (Signed)
Pt was walking down her steps at home around noon today, and "cartwheeled" down the steps.  Bruising noted on left bicep, complaining of left knee pain , pain above the ankle, and right hip pain.  Has been icing for 3 hours, took 800 mg ibuprofen at 12:30.

## 2019-02-10 IMAGING — DX DG HIP (WITH OR WITHOUT PELVIS) 2-3V*R*
3 series · 3 of 3 positions shown · non-contrast
Comparison: None.

CLINICAL DATA: Fell down stairs today.

EXAM:
DG HIP (WITH OR WITHOUT PELVIS) 2-3V RIGHT

[pelvis ap]
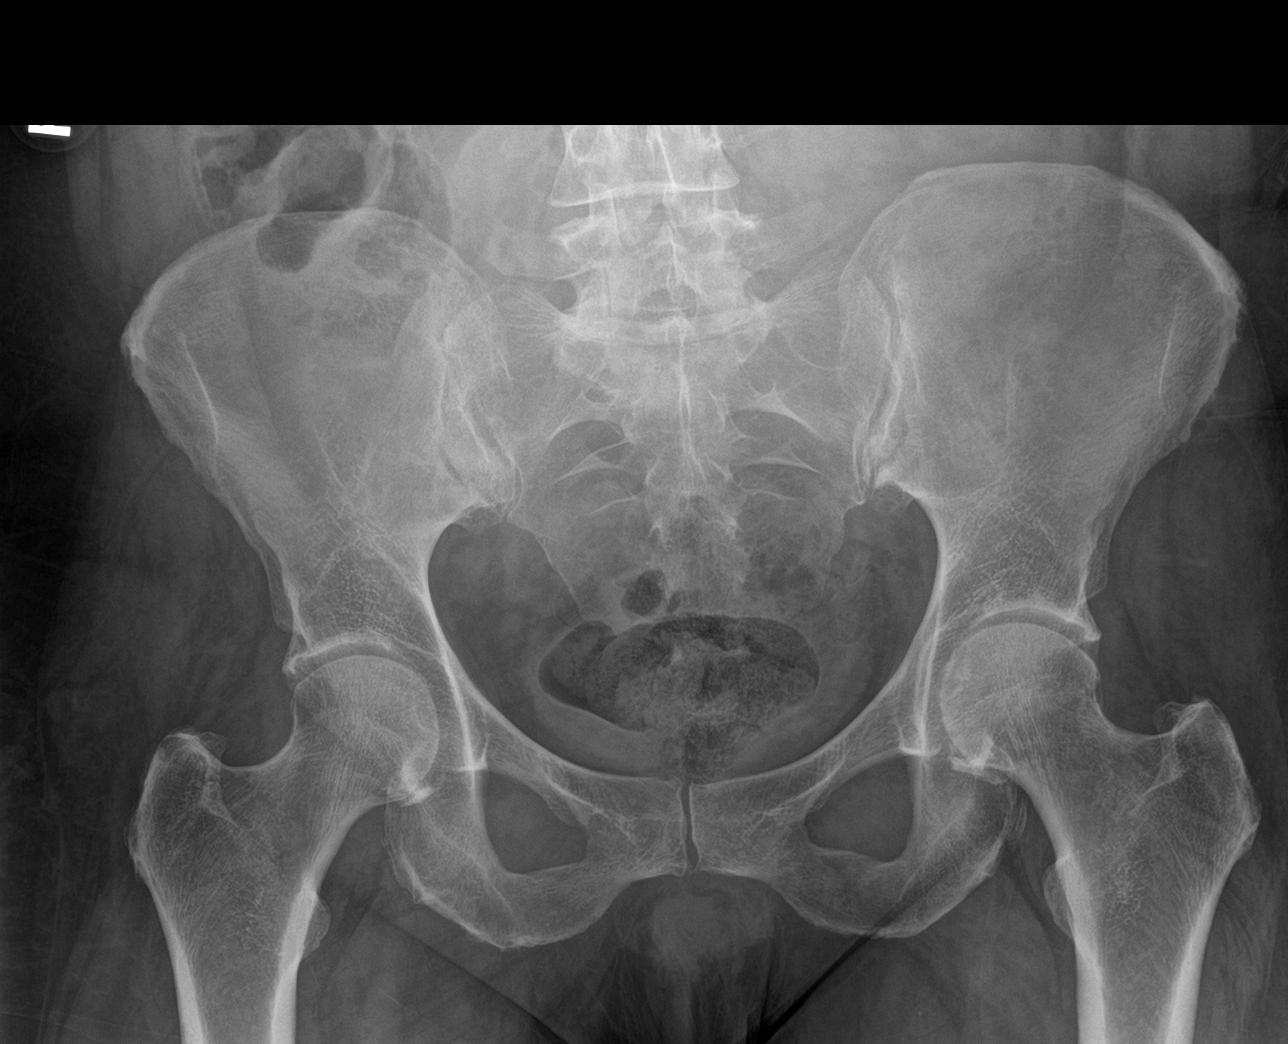

[hip ap]
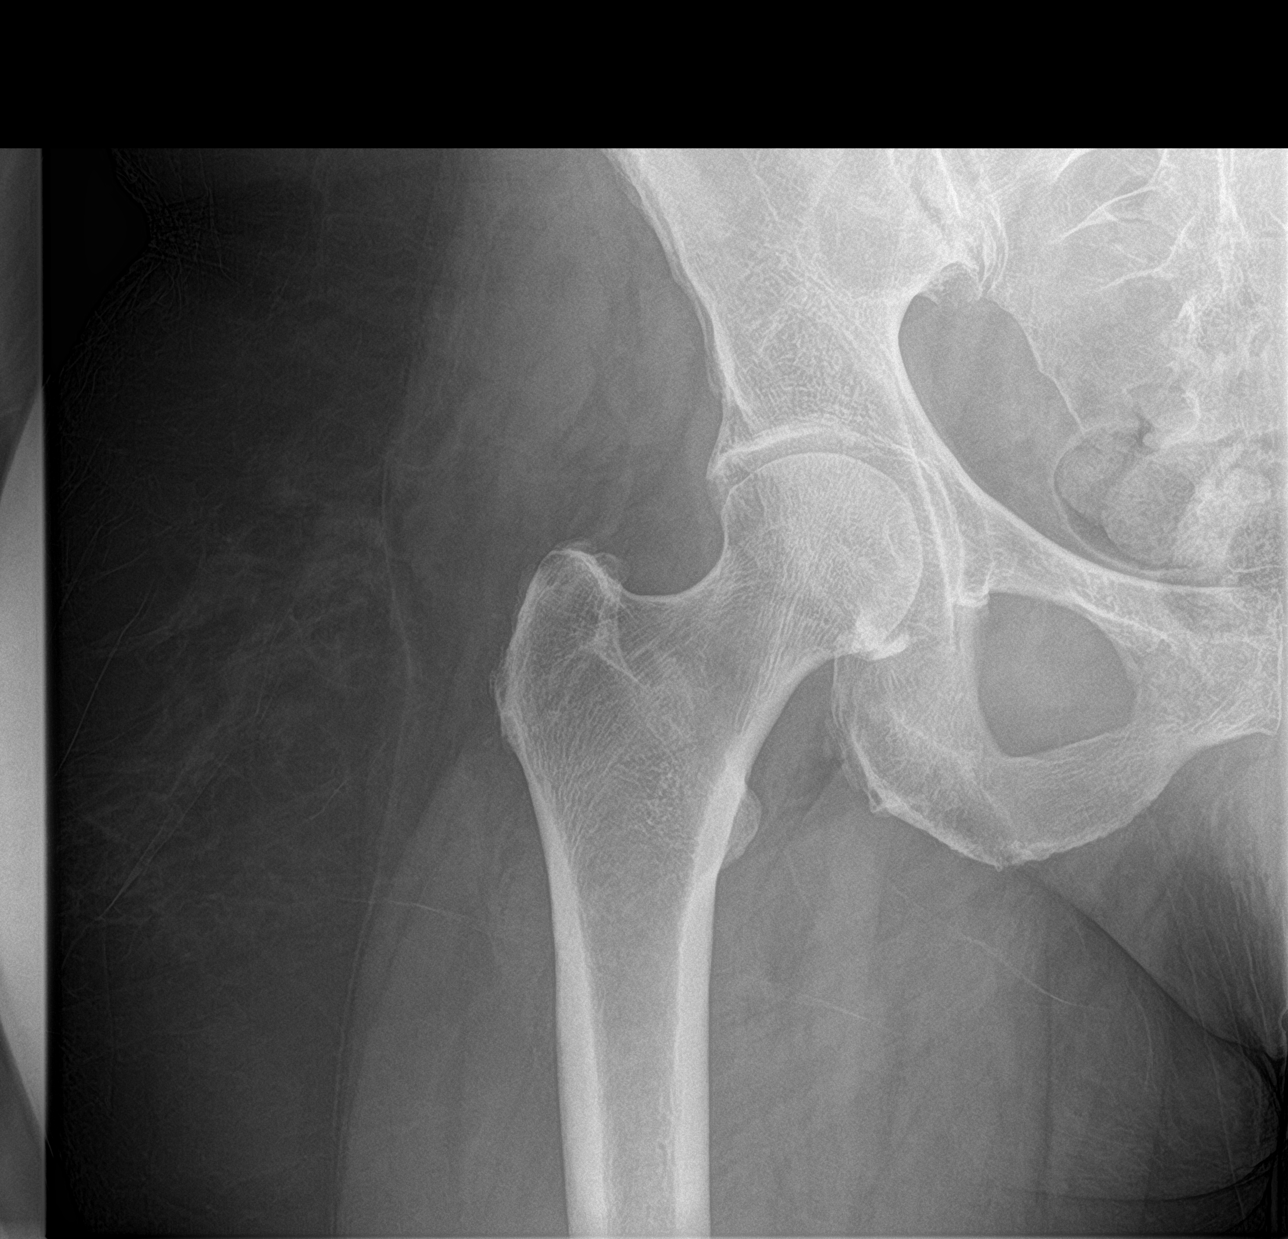

[hip lat]
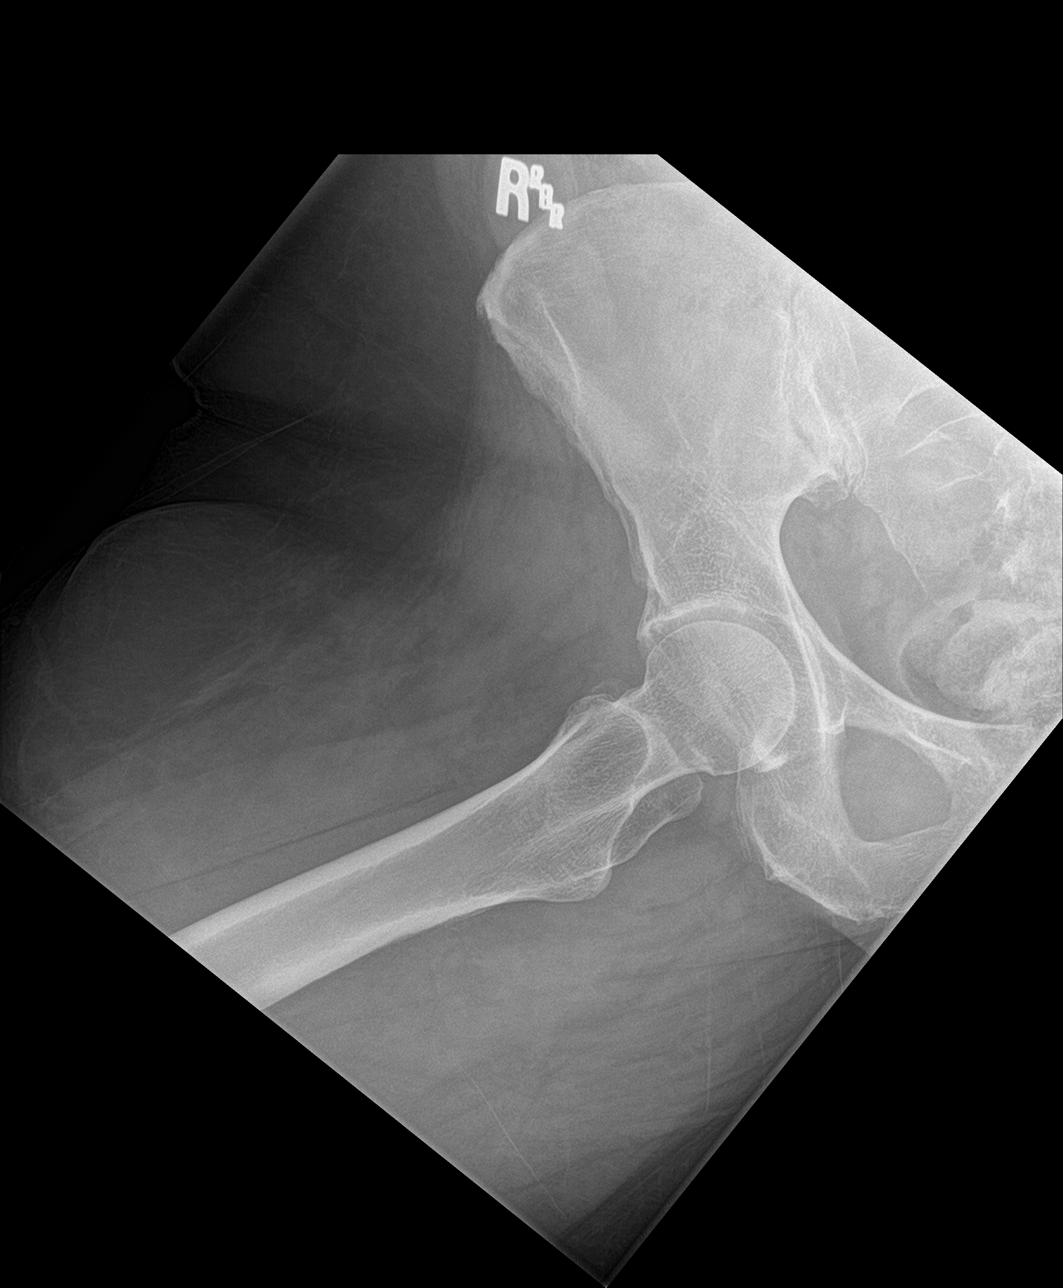

[3 of 3 positions shown; findings below may reference images not displayed]

FINDINGS: There is no evidence of hip fracture or dislocation. There is no
evidence of arthropathy or other focal bone abnormality.
IMPRESSION: Negative.
